# Patient Record
Sex: Male | Born: 1970 | Race: White | Hispanic: No | Marital: Single | Smoking: Former smoker
Health system: Southern US, Community
[De-identification: ages and names within clinical notes are randomized; demographics above are authoritative.]

## PROBLEM LIST (undated history)

## (undated) DIAGNOSIS — F419 Anxiety disorder, unspecified: Secondary | ICD-10-CM

## (undated) HISTORY — PX: CARPAL TUNNEL RELEASE: SHX101

## (undated) HISTORY — DX: Anxiety disorder, unspecified: F41.9

---

## 2015-02-03 ENCOUNTER — Other Ambulatory Visit: Payer: Self-pay | Admitting: Sports Medicine

## 2015-02-03 DIAGNOSIS — M545 Low back pain: Secondary | ICD-10-CM

## 2015-02-16 ENCOUNTER — Ambulatory Visit
Admission: RE | Admit: 2015-02-16 | Discharge: 2015-02-16 | Disposition: A | Discharge status: Home / Self Care | Payer: PRIVATE HEALTH INSURANCE | Source: Ambulatory Visit | Attending: Sports Medicine | Admitting: Sports Medicine

## 2015-02-16 ENCOUNTER — Other Ambulatory Visit: Payer: Self-pay | Admitting: Sports Medicine

## 2015-02-16 DIAGNOSIS — Z139 Encounter for screening, unspecified: Secondary | ICD-10-CM

## 2015-02-16 DIAGNOSIS — M545 Low back pain: Secondary | ICD-10-CM

## 2015-12-11 DIAGNOSIS — Z0001 Encounter for general adult medical examination with abnormal findings: Secondary | ICD-10-CM | POA: Diagnosis not present

## 2015-12-11 DIAGNOSIS — Z125 Encounter for screening for malignant neoplasm of prostate: Secondary | ICD-10-CM | POA: Diagnosis not present

## 2015-12-17 DIAGNOSIS — F419 Anxiety disorder, unspecified: Secondary | ICD-10-CM | POA: Diagnosis not present

## 2015-12-17 DIAGNOSIS — J309 Allergic rhinitis, unspecified: Secondary | ICD-10-CM | POA: Diagnosis not present

## 2015-12-17 DIAGNOSIS — Z0000 Encounter for general adult medical examination without abnormal findings: Secondary | ICD-10-CM | POA: Diagnosis not present

## 2016-03-10 DIAGNOSIS — Z23 Encounter for immunization: Secondary | ICD-10-CM | POA: Diagnosis not present

## 2016-06-10 DIAGNOSIS — E785 Hyperlipidemia, unspecified: Secondary | ICD-10-CM | POA: Diagnosis not present

## 2016-06-10 DIAGNOSIS — Z0000 Encounter for general adult medical examination without abnormal findings: Secondary | ICD-10-CM | POA: Diagnosis not present

## 2016-06-10 DIAGNOSIS — Z131 Encounter for screening for diabetes mellitus: Secondary | ICD-10-CM | POA: Diagnosis not present

## 2016-06-17 DIAGNOSIS — E669 Obesity, unspecified: Secondary | ICD-10-CM | POA: Diagnosis not present

## 2016-06-17 DIAGNOSIS — E7800 Pure hypercholesterolemia, unspecified: Secondary | ICD-10-CM | POA: Diagnosis not present

## 2016-07-05 IMAGING — MR MR LUMBAR SPINE W/O CM
5 series · 40 of 48 positions shown · non-contrast
Comparison: None.

CLINICAL DATA: Low back pain. Bilateral hip and buttock pain, lower
leg pain, and foot numbness for 3 months.

EXAM:
MRI LUMBAR SPINE WITHOUT CONTRAST
TECHNIQUE: Multiplanar, multisequence MR imaging of the lumbar spine was
performed. No intravenous contrast was administered.

[Series 3: T2 · sagittal · 4.0mm · 0.88mm/px · 6 of 12 slices shown (1 of 2)]
[im 1/12]
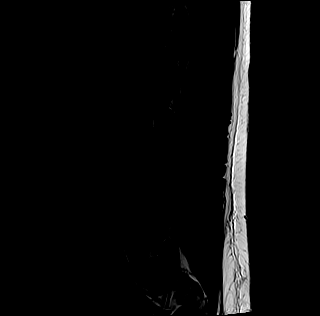
[im 3/12]
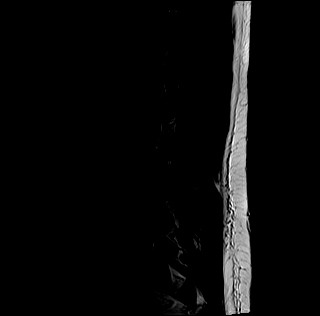
[im 5/12]
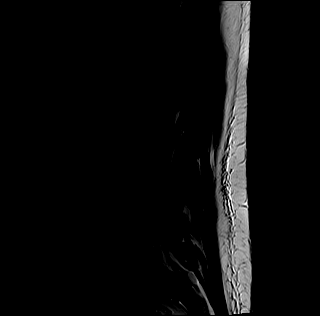
[im 7/12]
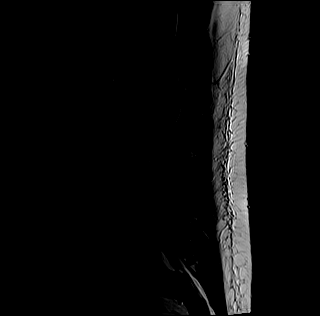
[im 9/12]
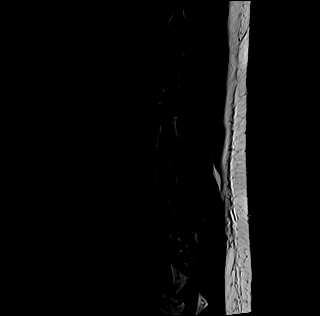
[im 12/12]
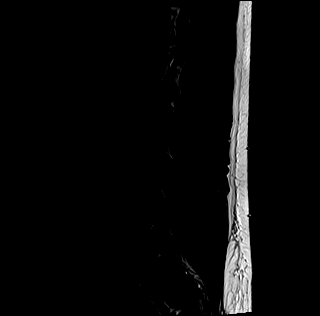

[Series 4: STIR · sagittal · 4.0mm · 0.55mm/px · 5 of 12 slices shown]
[im 1/12]
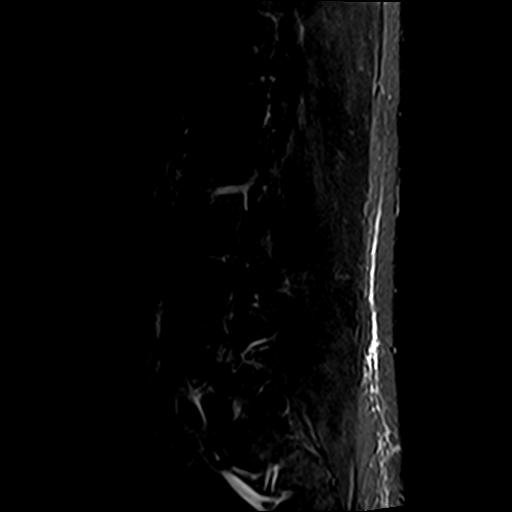
[im 3/12]
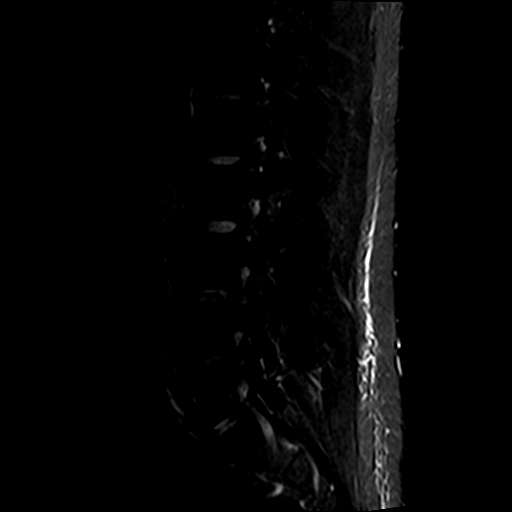
[im 6/12]
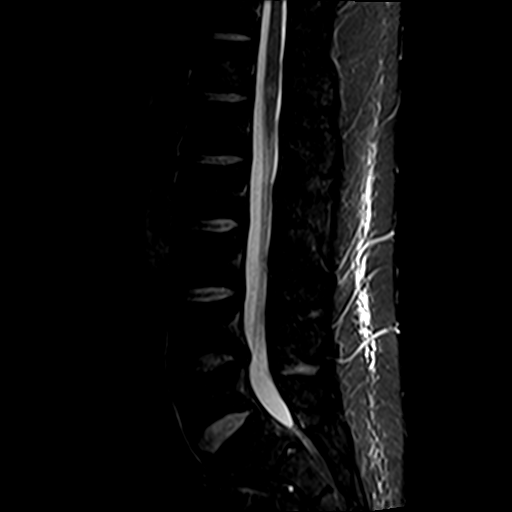
[im 9/12]
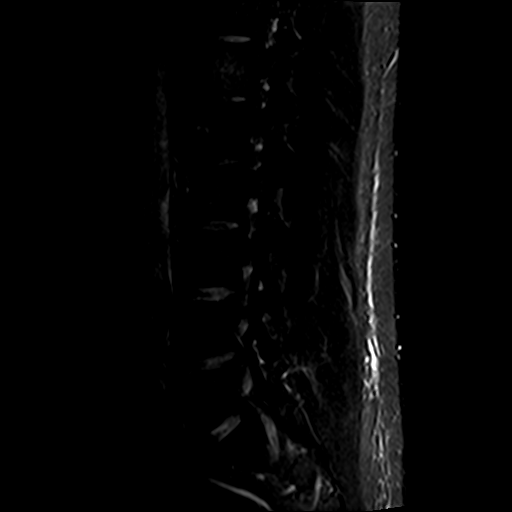
[im 12/12]
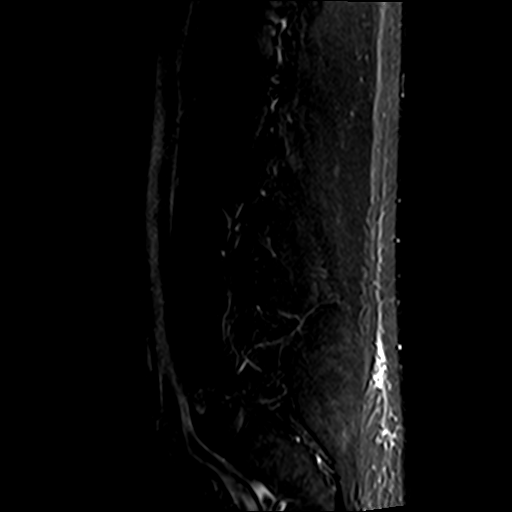

[Series 5: T1 · sagittal · 4.0mm · 0.88mm/px · 5 of 12 slices shown (1 of 2)]
[im 1/12]
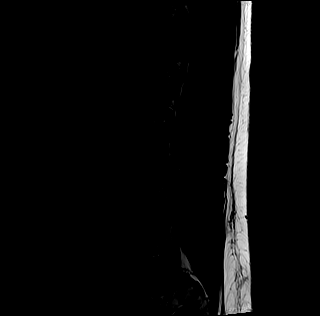
[im 3/12]
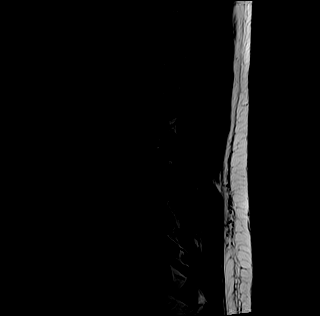
[im 6/12]
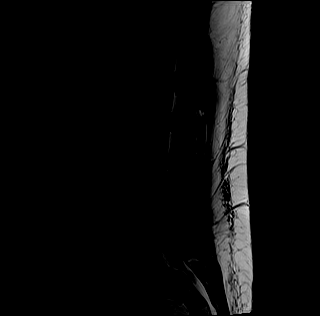
[im 9/12]
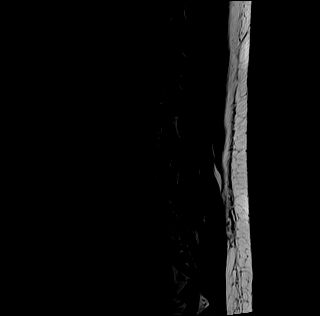
[im 12/12]
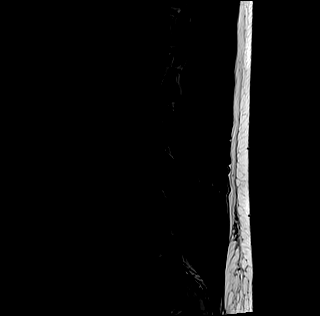

[Series 6: T2 · axial · 4.0mm · 0.70mm/px · z∈[-163,+19]mm · 14 of 36 slices shown (2 of 2)]
[im 1/36]
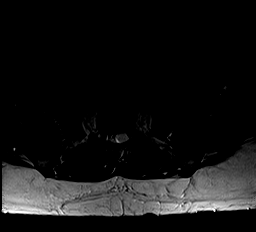
[im 3/36]
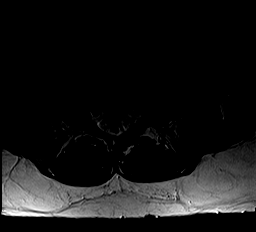
[im 5/36]
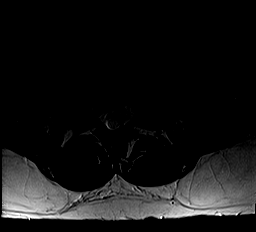
[im 8/36]
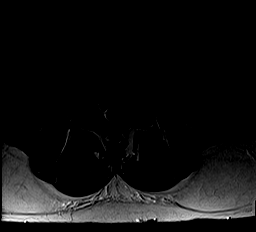
[im 10/36]
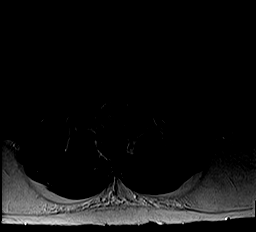
[im 12/36]
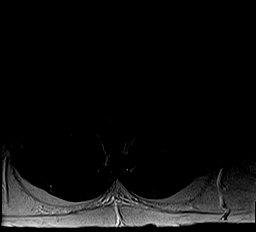
[im 15/36]
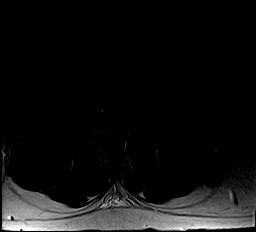
[im 17/36]
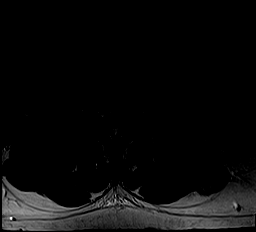
[im 19/36]
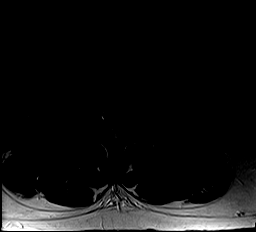
[im 22/36]
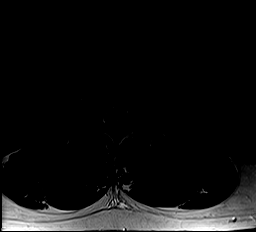
[im 24/36]
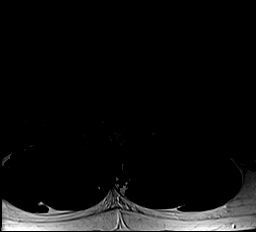
[im 26/36]
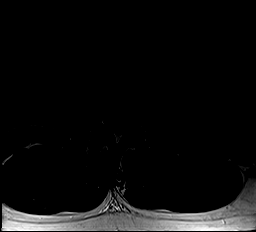
[im 31/36]
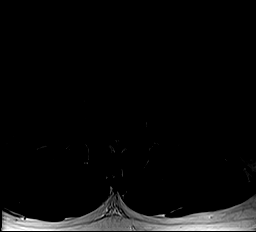
[im 36/36]
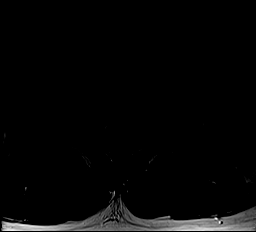

[Series 7: T1 · axial · 4.0mm · 0.47mm/px · z∈[-155,+19]mm · 10 of 36 slices shown (2 of 2)]
[im 3/36]
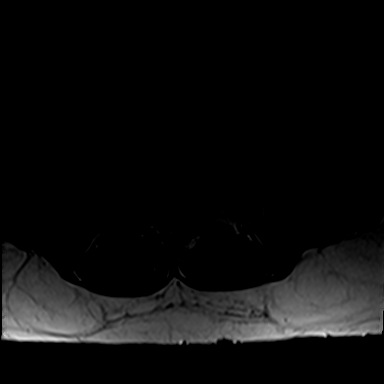
[im 5/36]
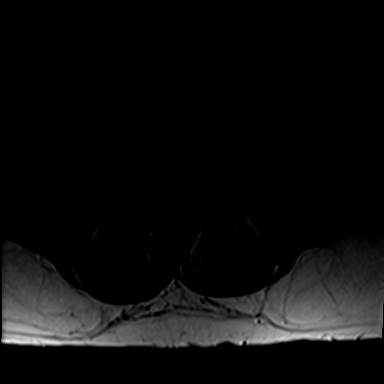
[im 8/36]
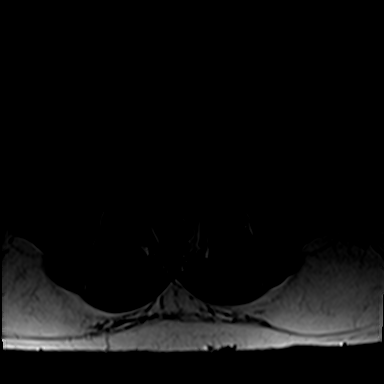
[im 12/36]
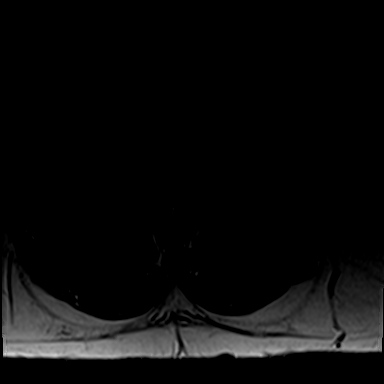
[im 17/36]
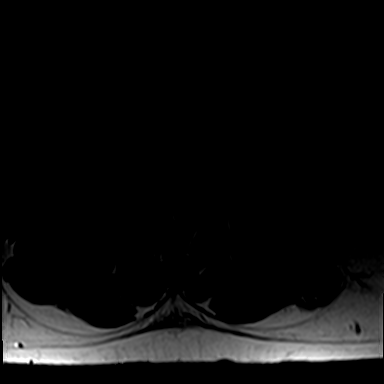
[im 19/36]
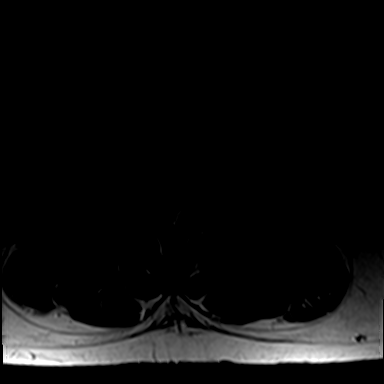
[im 22/36]
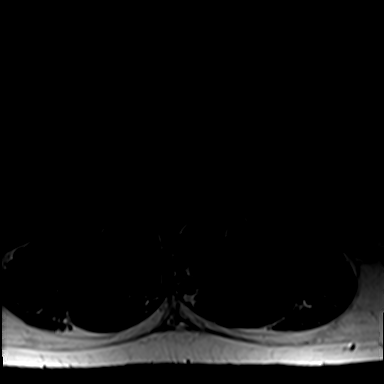
[im 26/36]
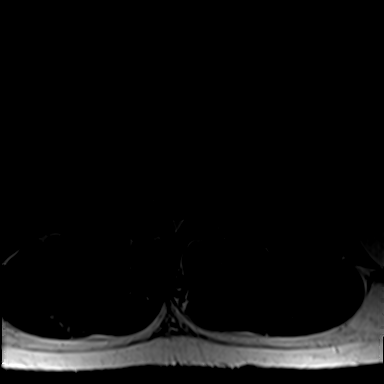
[im 31/36]
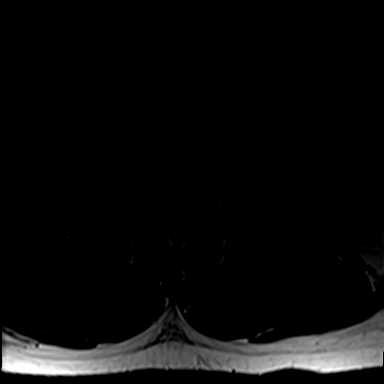
[im 36/36]
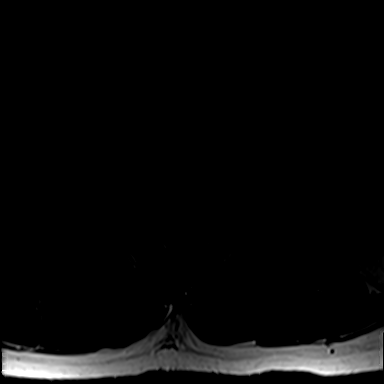

[40 of 48 positions shown; findings below may reference images not displayed]

FINDINGS: For the purposes of this dictation, the lowest well-formed
intervertebral disc space is presumed to be L5-S1.

There is straightening of the normal lumbar lordosis. There is no
significant listhesis. Vertebral body heights are preserved. Mild
disc desiccation is present at L4-5 with minimal disc space height
loss. Intervertebral discs elsewhere are well hydrated and
maintained in height. A 2.2 cm hemangioma is noted in the T12
vertebral body. Conus medullaris is normal in signal and terminates
at L1. Paraspinal soft tissues are unremarkable.

L1-2 through L3-4:  No disc herniation or stenosis.

L4-5: Small to moderate-sized central disc protrusion results in
mild-to-moderate bilateral lateral recess stenosis, potentially
affecting either L5 nerve root. There is circumferential disc
bulging with mild spinal stenosis and mild bilateral neural
foraminal stenosis.

L5-S1:  Negative.
IMPRESSION: L4-5 central disc protrusion with bilateral lateral recess stenosis,
potentially affecting the L5 nerve roots.

## 2016-12-22 DIAGNOSIS — E785 Hyperlipidemia, unspecified: Secondary | ICD-10-CM | POA: Diagnosis not present

## 2016-12-22 DIAGNOSIS — Z125 Encounter for screening for malignant neoplasm of prostate: Secondary | ICD-10-CM | POA: Diagnosis not present

## 2016-12-22 DIAGNOSIS — Z0001 Encounter for general adult medical examination with abnormal findings: Secondary | ICD-10-CM | POA: Diagnosis not present

## 2017-01-02 DIAGNOSIS — Z0000 Encounter for general adult medical examination without abnormal findings: Secondary | ICD-10-CM | POA: Diagnosis not present

## 2017-01-02 DIAGNOSIS — F419 Anxiety disorder, unspecified: Secondary | ICD-10-CM | POA: Diagnosis not present

## 2017-01-02 DIAGNOSIS — J309 Allergic rhinitis, unspecified: Secondary | ICD-10-CM | POA: Diagnosis not present

## 2017-01-02 DIAGNOSIS — E785 Hyperlipidemia, unspecified: Secondary | ICD-10-CM | POA: Diagnosis not present

## 2017-07-24 DIAGNOSIS — J029 Acute pharyngitis, unspecified: Secondary | ICD-10-CM | POA: Diagnosis not present

## 2017-07-24 DIAGNOSIS — K122 Cellulitis and abscess of mouth: Secondary | ICD-10-CM | POA: Diagnosis not present

## 2017-08-11 DIAGNOSIS — J309 Allergic rhinitis, unspecified: Secondary | ICD-10-CM | POA: Diagnosis not present

## 2017-08-11 DIAGNOSIS — H1045 Other chronic allergic conjunctivitis: Secondary | ICD-10-CM | POA: Diagnosis not present

## 2017-12-27 DIAGNOSIS — Z0000 Encounter for general adult medical examination without abnormal findings: Secondary | ICD-10-CM | POA: Diagnosis not present

## 2017-12-27 DIAGNOSIS — F419 Anxiety disorder, unspecified: Secondary | ICD-10-CM | POA: Diagnosis not present

## 2017-12-27 DIAGNOSIS — E559 Vitamin D deficiency, unspecified: Secondary | ICD-10-CM | POA: Diagnosis not present

## 2017-12-27 DIAGNOSIS — E785 Hyperlipidemia, unspecified: Secondary | ICD-10-CM | POA: Diagnosis not present

## 2017-12-27 DIAGNOSIS — Z125 Encounter for screening for malignant neoplasm of prostate: Secondary | ICD-10-CM | POA: Diagnosis not present

## 2018-01-03 DIAGNOSIS — Z0000 Encounter for general adult medical examination without abnormal findings: Secondary | ICD-10-CM | POA: Diagnosis not present

## 2018-01-16 DIAGNOSIS — K219 Gastro-esophageal reflux disease without esophagitis: Secondary | ICD-10-CM | POA: Diagnosis not present

## 2018-01-16 DIAGNOSIS — J342 Deviated nasal septum: Secondary | ICD-10-CM | POA: Diagnosis not present

## 2018-01-16 DIAGNOSIS — Z7289 Other problems related to lifestyle: Secondary | ICD-10-CM | POA: Diagnosis not present

## 2018-01-16 DIAGNOSIS — R0681 Apnea, not elsewhere classified: Secondary | ICD-10-CM | POA: Diagnosis not present

## 2018-01-18 DIAGNOSIS — M79604 Pain in right leg: Secondary | ICD-10-CM | POA: Diagnosis not present

## 2018-01-18 DIAGNOSIS — M79605 Pain in left leg: Secondary | ICD-10-CM | POA: Diagnosis not present

## 2018-01-18 DIAGNOSIS — M79669 Pain in unspecified lower leg: Secondary | ICD-10-CM | POA: Diagnosis not present

## 2018-04-16 DIAGNOSIS — K219 Gastro-esophageal reflux disease without esophagitis: Secondary | ICD-10-CM | POA: Diagnosis not present

## 2018-04-16 DIAGNOSIS — R0683 Snoring: Secondary | ICD-10-CM | POA: Diagnosis not present

## 2019-01-02 DIAGNOSIS — E785 Hyperlipidemia, unspecified: Secondary | ICD-10-CM | POA: Diagnosis not present

## 2019-01-02 DIAGNOSIS — Z125 Encounter for screening for malignant neoplasm of prostate: Secondary | ICD-10-CM | POA: Diagnosis not present

## 2019-01-02 DIAGNOSIS — Z0000 Encounter for general adult medical examination without abnormal findings: Secondary | ICD-10-CM | POA: Diagnosis not present

## 2019-01-09 DIAGNOSIS — Z7189 Other specified counseling: Secondary | ICD-10-CM | POA: Diagnosis not present

## 2019-01-09 DIAGNOSIS — Z0000 Encounter for general adult medical examination without abnormal findings: Secondary | ICD-10-CM | POA: Diagnosis not present

## 2019-01-09 DIAGNOSIS — E349 Endocrine disorder, unspecified: Secondary | ICD-10-CM | POA: Diagnosis not present

## 2019-01-09 DIAGNOSIS — E785 Hyperlipidemia, unspecified: Secondary | ICD-10-CM | POA: Diagnosis not present

## 2019-08-09 ENCOUNTER — Ambulatory Visit: Payer: PRIVATE HEALTH INSURANCE | Attending: Internal Medicine

## 2019-08-09 DIAGNOSIS — Z23 Encounter for immunization: Secondary | ICD-10-CM

## 2019-08-09 NOTE — Progress Notes (Signed)
   Covid-19 Vaccination Clinic  Name:  Brian Lin    MRN: [id number] DOB: 15-Mar-1971  08/09/2019  [name] was observed post Covid-19 immunization for 15 minutes without incident. He was provided with Vaccine Information Sheet and instruction to access the V-Safe system.   [name] was instructed to call 911 with any severe reactions post vaccine: Marland Kitchen Difficulty breathing  . Swelling of face and throat  . A fast heartbeat  . A bad rash all over body  . Dizziness and weakness   Immunizations Administered    Name Date Dose VIS Date Route   Pfizer COVID-19 Vaccine 08/09/2019  8:27 AM 0.3 mL 05/10/2019 Intramuscular   Manufacturer: [place]   Lot: [id number]   [place]: [address]-4

## 2019-09-02 ENCOUNTER — Ambulatory Visit: Payer: PRIVATE HEALTH INSURANCE | Attending: Internal Medicine

## 2019-09-02 DIAGNOSIS — Z23 Encounter for immunization: Secondary | ICD-10-CM

## 2019-09-02 NOTE — Progress Notes (Signed)
   Covid-19 Vaccination Clinic  Name:  Brian Lin    MRN: [id number] DOB: 12-Nov-1970  09/02/2019  [name] was observed post Covid-19 immunization for 15 minutes without incident. He was provided with Vaccine Information Sheet and instruction to access the V-Safe system.   [name] was instructed to call 911 with any severe reactions post vaccine: Marland Kitchen Difficulty breathing  . Swelling of face and throat  . A fast heartbeat  . A bad rash all over body  . Dizziness and weakness   Immunizations Administered    Name Date Dose VIS Date Route   Pfizer COVID-19 Vaccine 09/02/2019 11:29 AM 0.3 mL 05/10/2019 Intramuscular   Manufacturer: [place]   Lot: [id number]   [place]: [address]-9

## 2019-11-30 DIAGNOSIS — Z20822 Contact with and (suspected) exposure to covid-19: Secondary | ICD-10-CM | POA: Diagnosis not present

## 2019-12-09 DIAGNOSIS — K602 Anal fissure, unspecified: Secondary | ICD-10-CM | POA: Diagnosis not present

## 2020-01-06 DIAGNOSIS — Z0000 Encounter for general adult medical examination without abnormal findings: Secondary | ICD-10-CM | POA: Diagnosis not present

## 2020-01-06 DIAGNOSIS — E349 Endocrine disorder, unspecified: Secondary | ICD-10-CM | POA: Diagnosis not present

## 2020-01-06 DIAGNOSIS — E785 Hyperlipidemia, unspecified: Secondary | ICD-10-CM | POA: Diagnosis not present

## 2020-01-06 DIAGNOSIS — R7309 Other abnormal glucose: Secondary | ICD-10-CM | POA: Diagnosis not present

## 2020-01-13 DIAGNOSIS — R7309 Other abnormal glucose: Secondary | ICD-10-CM | POA: Diagnosis not present

## 2020-01-13 DIAGNOSIS — Z0000 Encounter for general adult medical examination without abnormal findings: Secondary | ICD-10-CM | POA: Diagnosis not present

## 2020-01-13 DIAGNOSIS — R7989 Other specified abnormal findings of blood chemistry: Secondary | ICD-10-CM | POA: Diagnosis not present

## 2020-01-13 DIAGNOSIS — E785 Hyperlipidemia, unspecified: Secondary | ICD-10-CM | POA: Diagnosis not present

## 2021-01-06 DIAGNOSIS — E785 Hyperlipidemia, unspecified: Secondary | ICD-10-CM | POA: Diagnosis not present

## 2021-01-06 DIAGNOSIS — Z125 Encounter for screening for malignant neoplasm of prostate: Secondary | ICD-10-CM | POA: Diagnosis not present

## 2021-01-06 DIAGNOSIS — R7309 Other abnormal glucose: Secondary | ICD-10-CM | POA: Diagnosis not present

## 2021-01-06 DIAGNOSIS — R7989 Other specified abnormal findings of blood chemistry: Secondary | ICD-10-CM | POA: Diagnosis not present

## 2022-01-11 DIAGNOSIS — R7309 Other abnormal glucose: Secondary | ICD-10-CM | POA: Diagnosis not present

## 2022-01-11 DIAGNOSIS — E782 Mixed hyperlipidemia: Secondary | ICD-10-CM | POA: Diagnosis not present

## 2022-01-11 DIAGNOSIS — Z125 Encounter for screening for malignant neoplasm of prostate: Secondary | ICD-10-CM | POA: Diagnosis not present

## 2022-01-11 DIAGNOSIS — R7989 Other specified abnormal findings of blood chemistry: Secondary | ICD-10-CM | POA: Diagnosis not present

## 2022-01-11 DIAGNOSIS — Z0000 Encounter for general adult medical examination without abnormal findings: Secondary | ICD-10-CM | POA: Diagnosis not present

## 2022-01-18 DIAGNOSIS — Z0000 Encounter for general adult medical examination without abnormal findings: Secondary | ICD-10-CM | POA: Diagnosis not present

## 2022-01-18 DIAGNOSIS — E785 Hyperlipidemia, unspecified: Secondary | ICD-10-CM | POA: Diagnosis not present

## 2022-07-19 DIAGNOSIS — Z0100 Encounter for examination of eyes and vision without abnormal findings: Secondary | ICD-10-CM | POA: Diagnosis not present

## 2022-09-20 DIAGNOSIS — K648 Other hemorrhoids: Secondary | ICD-10-CM | POA: Diagnosis not present

## 2022-09-20 DIAGNOSIS — R030 Elevated blood-pressure reading, without diagnosis of hypertension: Secondary | ICD-10-CM | POA: Diagnosis not present

## 2022-10-25 DIAGNOSIS — K644 Residual hemorrhoidal skin tags: Secondary | ICD-10-CM | POA: Diagnosis not present

## 2022-10-25 DIAGNOSIS — K625 Hemorrhage of anus and rectum: Secondary | ICD-10-CM | POA: Diagnosis not present

## 2022-10-25 DIAGNOSIS — K648 Other hemorrhoids: Secondary | ICD-10-CM | POA: Diagnosis not present

## 2022-10-28 ENCOUNTER — Encounter: Payer: Self-pay | Admitting: Gastroenterology

## 2023-01-16 DIAGNOSIS — Z125 Encounter for screening for malignant neoplasm of prostate: Secondary | ICD-10-CM | POA: Diagnosis not present

## 2023-01-16 DIAGNOSIS — E785 Hyperlipidemia, unspecified: Secondary | ICD-10-CM | POA: Diagnosis not present

## 2023-01-16 DIAGNOSIS — R7989 Other specified abnormal findings of blood chemistry: Secondary | ICD-10-CM | POA: Diagnosis not present

## 2023-01-16 DIAGNOSIS — R7309 Other abnormal glucose: Secondary | ICD-10-CM | POA: Diagnosis not present

## 2023-01-23 DIAGNOSIS — Z0000 Encounter for general adult medical examination without abnormal findings: Secondary | ICD-10-CM | POA: Diagnosis not present

## 2023-01-23 DIAGNOSIS — R7989 Other specified abnormal findings of blood chemistry: Secondary | ICD-10-CM | POA: Diagnosis not present

## 2023-01-23 DIAGNOSIS — R7309 Other abnormal glucose: Secondary | ICD-10-CM | POA: Diagnosis not present

## 2023-01-23 DIAGNOSIS — E785 Hyperlipidemia, unspecified: Secondary | ICD-10-CM | POA: Diagnosis not present

## 2023-01-25 ENCOUNTER — Encounter: Payer: Self-pay | Admitting: Gastroenterology

## 2023-01-26 ENCOUNTER — Ambulatory Visit: Payer: BC Managed Care – PPO | Admitting: Gastroenterology

## 2023-01-26 ENCOUNTER — Encounter: Payer: Self-pay | Admitting: Gastroenterology

## 2023-01-26 VITALS — BP 122/70 | HR 74 | Ht 70.0 in | Wt 259.0 lb

## 2023-01-26 DIAGNOSIS — K625 Hemorrhage of anus and rectum: Secondary | ICD-10-CM

## 2023-01-26 DIAGNOSIS — Z1211 Encounter for screening for malignant neoplasm of colon: Secondary | ICD-10-CM | POA: Diagnosis not present

## 2023-01-26 DIAGNOSIS — K219 Gastro-esophageal reflux disease without esophagitis: Secondary | ICD-10-CM | POA: Diagnosis not present

## 2023-01-26 MED ORDER — NA SULFATE-K SULFATE-MG SULF 17.5-3.13-1.6 GM/177ML PO SOLN: 1.0000 | Freq: Once | ORAL | 0 refills | Status: AC

## 2023-01-26 NOTE — Patient Instructions (Signed)
You have been scheduled for an endoscopy and colonoscopy. Please follow the written instructions given to you at your visit today.  Please pick up your prep supplies at the pharmacy within the next 1-3 days.  If you use inhalers (even only as needed), please bring them with you on the day of your procedure.  DO NOT TAKE 7 DAYS PRIOR TO TEST- Trulicity (dulaglutide) Ozempic, Wegovy (semaglutide) Mounjaro (tirzepatide) Bydureon Bcise (exanatide extended release)  DO NOT TAKE 1 DAY PRIOR TO YOUR TEST Rybelsus (semaglutide) Adlyxin (lixisenatide) Victoza (liraglutide) Byetta (exanatide) ___________________________________________________________________________   If your blood pressure at your visit was 140/90 or greater, please contact your primary care physician to follow up on this.  _______________________________________________________  If you are age 55 or older, your body mass index should be between 23-30. Your Body mass index is 37.16 kg/m. If this is out of the aforementioned range listed, please consider follow up with your Primary Care Provider.  If you are age 5 or younger, your body mass index should be between 19-25. Your Body mass index is 37.16 kg/m. If this is out of the aformentioned range listed, please consider follow up with your Primary Care Provider.   ________________________________________________________  The [facility] providers would like to encourage you to use [facility] to communicate with providers for non-urgent requests or questions.  Due to long hold times on the telephone, sending your provider a message by [facility] may be a faster and more efficient way to get a response.  Please allow 48 business hours for a response.  Please remember that this is for non-urgent requests.  _______________________________________________________

## 2023-01-26 NOTE — Progress Notes (Signed)
HPI : Brian Lin is a 52 y.o. male who is referred to [place] by Andria Meuse, MD for consideration of colonoscopy.  The patient was initially referred to Dr. Cliffton Asters for further management of hemorrhoidal symptoms.  His symptoms consisted of bright red blood per rectum and perianal swelling/prolapse.  Dr. Cliffton Asters performed an anoscopy which showed 2 prominent hemorrhoid columns.  He recommended treatment with Anusol suppositories, and discussed optimizing bowel habits and toilet habits. Today, the patient denies any hemorrhoidal symptoms.  Specifically, he denies symptoms of bright red blood per rectum, perianal itching or burning, perianal swelling, prolapse or issues with seepage or difficulties with cleanliness in the area. He reports having regular bowel movements and denies problems with hard stools, straining or diarrhea.  No family history of colon cancer.  The patient reportedly had a negative Cologuard a year ago (results not available for review).  He reports his girlfriend was diagnosed with colon cancer at age 3 last year.  When asked, he does have a long history of typical GERD symptoms consisting of heartburn and acid regurgitation.  He started having symptoms over 15 years ago.  He had previously taken PPIs, which helped.  He made dietary improvements, and this is decreased his frequency of symptoms.  Currently, he estimates having GERD symptoms maybe once or twice a week, for which he takes over-the-counter antacids.  He has a history of chronic tobacco use, but quit many years ago.  No family history of esophageal cancer. No dysphagia, no unintentional weight loss.    Past Medical History:  Diagnosis Date   Anxiety      Past Surgical History:  Procedure Laterality Date   CARPAL TUNNEL RELEASE     Family History  Problem Relation Age of Onset   Obesity Father    High blood pressure Sister    High blood pressure Sister    Colon cancer Neg Hx    Stomach cancer Neg Hx     Esophageal cancer Neg Hx    Social History   Tobacco Use   Smoking status: Former    Types: Cigarettes   Smokeless tobacco: Never  Vaping Use   Vaping status: Never Used  Substance Use Topics   Alcohol use: Yes    Comment: 6 drink a week   Drug use: Never   Current Outpatient Medications  Medication Sig Dispense Refill   b complex vitamins capsule Take 1 capsule by mouth daily.     Na Sulfate-K Sulfate-Mg Sulf 17.5-3.13-1.6 GM/177ML SOLN Take 1 kit by mouth once for 1 dose. 354 mL 0   No current facility-administered medications for this visit.   Allergies  Allergen Reactions   Codeine Rash   Sulfamethoxazole Rash     Review of Systems: All systems reviewed and negative except where noted in HPI.    No results found.  Physical Exam: BP 122/70   Pulse 74   Ht 5\' 10"  (1.778 m)   Wt 259 lb (117.5 kg)   BMI 37.16 kg/m  Constitutional: Pleasant,well-developed, Caucasian male in no acute distress. HEENT: Normocephalic and atraumatic. Conjunctivae are normal. No scleral icterus. Neck supple.  Cardiovascular: Normal rate, regular rhythm.  Pulmonary/chest: Effort normal and breath sounds normal. No wheezing, rales or rhonchi. Abdominal: Soft, nondistended, nontender. Bowel sounds active throughout. There are no masses palpable. No hepatomegaly. Extremities: no edema Lymphadenopathy: No cervical adenopathy noted. Neurological: Alert and oriented to person place and time. Skin: Skin is warm and dry. No rashes noted.  Psychiatric: Normal mood and affect. Behavior is normal.  CBC No results found for: "WBC", "RBC", "HGB", "HCT", "PLT", "MCV", "MCH", "MCHC", "RDW", "LYMPHSABS", "MONOABS", "EOSABS", "BASOSABS"  CMP  No results found for: "NA", "K", "CL", "CO2", "GLUCOSE", "BUN", "CREATININE", "CALCIUM", "PROT", "ALBUMIN", "AST", "ALT", "ALKPHOS", "BILITOT", "GFRNONAA", "GFRAA"      No data to display            ASSESSMENT AND PLAN:  52 year old male with  history of typical hemorrhoid symptoms (bright red blood per rectum, perianal swelling/prolapse, currently resolved.  He denies any hemorrhoidal symptoms or alterations in his bowels currently.  He had a negative Cologuard last year, but given his recent bright red blood per rectum and no prior history of colonoscopy, I do think a colonoscopy is reasonable to definitively exclude mass lesion.  The patient is in agreement with this.  Also, given his longstanding GERD symptoms as well as Barrett's risk factors to include sex, age, obesity and history of tobacco use, I do think an EGD to screen for Barrett's is reasonable.  Given his relatively infrequent symptoms currently, I recommend he continue using as needed medicines.  If we do find evidence of Barrett's or other complications of GERD, he will need to take PPI. Will plan for EGD and colonoscopy.  Bright red blood per rectum/colon cancer screening - Colonoscopy  Longstanding GERD with Barrett's risk factors - EGD for Barrett's screening  The details, risks (including bleeding, perforation, infection, missed lesions, medication reactions and possible hospitalization or surgery if complications occur), benefits, and alternatives to colonoscopy with possible biopsy and possible polypectomy were discussed with the patient and he consents to proceed.   Kalayna Noy [name], MD [facility]    Cliffton Asters Stephanie Coup, MD

## 2023-01-27 ENCOUNTER — Encounter: Payer: Self-pay | Admitting: Gastroenterology

## 2023-02-09 ENCOUNTER — Encounter: Payer: Self-pay | Admitting: Gastroenterology

## 2023-02-09 ENCOUNTER — Ambulatory Visit (AMBULATORY_SURGERY_CENTER): Payer: BC Managed Care – PPO | Admitting: Gastroenterology

## 2023-02-09 VITALS — BP 146/90 | HR 77 | Temp 98.9°F | Resp 12 | Ht 70.0 in | Wt 259.0 lb

## 2023-02-09 DIAGNOSIS — K635 Polyp of colon: Secondary | ICD-10-CM | POA: Diagnosis not present

## 2023-02-09 DIAGNOSIS — K625 Hemorrhage of anus and rectum: Secondary | ICD-10-CM | POA: Diagnosis not present

## 2023-02-09 DIAGNOSIS — Z1211 Encounter for screening for malignant neoplasm of colon: Secondary | ICD-10-CM

## 2023-02-09 DIAGNOSIS — K219 Gastro-esophageal reflux disease without esophagitis: Secondary | ICD-10-CM

## 2023-02-09 DIAGNOSIS — D122 Benign neoplasm of ascending colon: Secondary | ICD-10-CM | POA: Diagnosis not present

## 2023-02-09 DIAGNOSIS — D123 Benign neoplasm of transverse colon: Secondary | ICD-10-CM

## 2023-02-09 DIAGNOSIS — K208 Other esophagitis without bleeding: Secondary | ICD-10-CM | POA: Diagnosis not present

## 2023-02-09 DIAGNOSIS — K2100 Gastro-esophageal reflux disease with esophagitis, without bleeding: Secondary | ICD-10-CM

## 2023-02-09 MED ORDER — OMEPRAZOLE 40 MG PO CPDR: 40.0000 mg | DELAYED_RELEASE_CAPSULE | Freq: Every day | ORAL | 1 refills | Status: AC

## 2023-02-09 MED ORDER — SODIUM CHLORIDE 0.9 % IV SOLN: 500.0000 mL | Freq: Once | INTRAVENOUS | Status: DC

## 2023-02-09 NOTE — Op Note (Signed)
[facility] Patient Name: Brian Lin Procedure Date: 02/09/2023 2:05 PM MRN: [id number] Endoscopist: [name] [name] , MD, [id number] Age: 52 Referring MD:  Date of Birth: 31-Jan-1971 Gender: Male Account #: [id number] Procedure:                Colonoscopy Indications:              This is the patient's first colonoscopy,                            Hematochezia Medicines:                Monitored Anesthesia Care Procedure:                Pre-Anesthesia Assessment:                           - Prior to the procedure, a History and Physical                            was performed, and patient medications and                            allergies were reviewed. The patient's tolerance of                            previous anesthesia was also reviewed. The risks                            and benefits of the procedure and the sedation                            options and risks were discussed with the patient.                            All questions were answered, and informed consent                            was obtained. Prior Anticoagulants: The patient has                            taken no anticoagulant or antiplatelet agents. ASA                            Grade Assessment: II - A patient with mild systemic                            disease. After reviewing the risks and benefits,                            the patient was deemed in satisfactory condition to                            undergo the procedure.  After obtaining informed consent, the colonoscope                            was passed under direct vision. Throughout the                            procedure, the patient's blood pressure, pulse, and                            oxygen saturations were monitored continuously. The                            CF [id number] #[id number] was introduced through the anus                            and advanced to the the cecum, identified by                             appendiceal orifice and ileocecal valve. The                            colonoscopy was performed with moderate difficulty                            due to a redundant colon and significant looping.                            Successful completion of the procedure was aided by                            using manual pressure and withdrawing and                            reinserting the scope. Visualization of the cecum                            was difficult due to significant looping (the                            colonoscope was 'hubbed') and copious adherent                            stool in the cecum. The patient tolerated the                            procedure well. The quality of the bowel                            preparation was good except the cecum was fair. The                            ileocecal valve, appendiceal orifice, and rectum  were photographed. Scope In: 2:24:28 PM Scope Out: 2:55:51 PM Scope Withdrawal Time: 0 hours 16 minutes 53 seconds  Total Procedure Duration: 0 hours 31 minutes 23 seconds  Findings:                 The perianal and digital rectal examinations were                            normal. Pertinent negatives include normal                            sphincter tone and no palpable rectal lesions.                           A 3 mm polyp was found in the ascending colon. The                            polyp was sessile. The polyp was removed with a                            cold snare. Resection and retrieval were complete.                            Estimated blood loss was minimal.                           A 4 mm polyp was found in the transverse colon. The                            polyp was sessile. The polyp was removed with a                            cold snare. Resection and retrieval were complete.                            Estimated blood loss was minimal.                           Many  large-mouthed and small-mouthed diverticula                            were found in the sigmoid colon, descending colon                            and transverse colon. There was no evidence of                            diverticular bleeding.                           The exam was otherwise normal throughout the                            examined colon.  The retroflexed view of the distal rectum and anal                            verge was normal and showed no anal or rectal                            abnormalities. Complications:            No immediate complications. Estimated Blood Loss:     Estimated blood loss was minimal. Impression:               - One 3 mm polyp in the ascending colon, removed                            with a cold snare. Resected and retrieved.                           - One 4 mm polyp in the transverse colon, removed                            with a cold snare. Resected and retrieved.                           - Moderate diverticulosis in the sigmoid colon, in                            the descending colon and in the transverse colon.                            There was no evidence of diverticular bleeding.                           - The distal rectum and anal verge are normal on                            retroflexion view. Recommendation:           - Patient has a contact number available for                            emergencies. The signs and symptoms of potential                            delayed complications were discussed with the                            patient. Return to normal activities tomorrow.                            Written discharge instructions were provided to the                            patient.                           -  Resume previous diet.                           - Continue present medications.                           - Await pathology results.                           - Repeat  colonoscopy in 3 years for surveillance                            due to presence of polyps, fair prep in cecum and                            technical difficulty of the procedure.                           - Recommend high fiber diet or daily fiber                            supplementation to reduce risk of diverticular                            complications. Amarri Michaelson [name], MD 02/09/2023 3:13:06 PM This report has been signed electronically.

## 2023-02-09 NOTE — Progress Notes (Signed)
History and Physical Interval Note:  02/09/2023 2:04 PM  Brian Lin  has presented today for endoscopic procedure(s), with the diagnosis of  Encounter Diagnoses  Name Primary?   Bright red blood per rectum Yes   Gastroesophageal reflux disease, unspecified whether esophagitis present   .  The various methods of evaluation and treatment have been discussed with the patient and/or family. After consideration of risks, benefits and other options for treatment, the patient has consented to  the endoscopic procedure(s).   The patient's history has been reviewed, patient examined, no change in status, stable for endoscopic procedure(s).  I have reviewed the patient's chart and labs.  Questions were answered to the patient's satisfaction.     Daleiza Bacchi [name], MD [facility]

## 2023-02-09 NOTE — Progress Notes (Signed)
Report to PACU, RN, vss, BBS= Clear.  

## 2023-02-09 NOTE — Op Note (Signed)
[facility] Patient Name: Brian Lin Procedure Date: 02/09/2023 2:06 PM MRN: [id number] Endoscopist: [name] [name] , MD, [id number] Age: 52 Referring MD:  Date of Birth: 03-05-1971 Gender: Male Account #: [id number] Procedure:                Upper GI endoscopy Indications:              Screening for Barrett's esophagus Medicines:                Monitored Anesthesia Care Procedure:                Pre-Anesthesia Assessment:                           - Prior to the procedure, a History and Physical                            was performed, and patient medications and                            allergies were reviewed. The patient's tolerance of                            previous anesthesia was also reviewed. The risks                            and benefits of the procedure and the sedation                            options and risks were discussed with the patient.                            All questions were answered, and informed consent                            was obtained. Prior Anticoagulants: The patient has                            taken no anticoagulant or antiplatelet agents. ASA                            Grade Assessment: II - A patient with mild systemic                            disease. After reviewing the risks and benefits,                            the patient was deemed in satisfactory condition to                            undergo the procedure.                           After obtaining informed consent, the endoscope was  passed under direct vision. Throughout the                            procedure, the patient's blood pressure, pulse, and                            oxygen saturations were monitored continuously. The                            GIF [id number] #[id number] was introduced through the                            mouth, and advanced to the second part of duodenum.                            The upper GI  endoscopy was accomplished without                            difficulty. The patient tolerated the procedure                            well. Scope In: Scope Out: Findings:                 The examined portions of the nasopharynx,                            oropharynx and larynx were normal.                           LA Grade A (one or more mucosal breaks less than 5                            mm, not extending between tops of 2 mucosal folds)                            esophagitis with no bleeding was found.                           One tongue of salmon-colored mucosa was present at                            38 cm. The maximum longitudinal extent of these                            esophageal mucosal changes was 1 cm in length.                            Biopsies were taken with a cold forceps for                            histology. Estimated blood loss was minimal.  The exam of the esophagus was otherwise normal.                           The gastroesophageal flap valve was visualized                            endoscopically and classified as Hill Grade IV (no                            fold, wide open lumen, hiatal hernia present).                           A 2 cm hiatal hernia was present.                           The entire examined stomach was normal.                           The examined duodenum was normal. Complications:            No immediate complications. Estimated Blood Loss:     Estimated blood loss was minimal. Impression:               - The examined portions of the nasopharynx,                            oropharynx and larynx were normal.                           - LA Grade A reflux esophagitis with no bleeding.                           - Salmon-colored mucosa suspicious for                            short-segment Barrett's esophagus. Biopsied.                           - Gastroesophageal flap valve classified as Hill                             Grade IV (no fold, wide open lumen, hiatal hernia                            present).                           - 2 cm hiatal hernia.                           - Normal stomach.                           - Normal examined duodenum. Recommendation:           - Patient has a contact number available for  emergencies. The signs and symptoms of potential                            delayed complications were discussed with the                            patient. Return to normal activities tomorrow.                            Written discharge instructions were provided to the                            patient.                           - Resume previous diet.                           - Continue present medications.                           - Await pathology results.                           - Use Prilosec (omeprazole) 40 mg PO daily for 8                            weeks to promote healing of esophagitis. [name] [name], MD 02/09/2023 3:03:57 PM This report has been signed electronically.

## 2023-02-09 NOTE — Patient Instructions (Signed)

## 2023-02-09 NOTE — Progress Notes (Signed)
Called to room to assist during endoscopic procedure.  Patient ID and intended procedure confirmed with present staff. Received instructions for my participation in the procedure from the performing physician.  

## 2023-02-09 NOTE — Progress Notes (Signed)
Pt's states no medical or surgical changes since previsit or office visit. 

## 2023-02-10 ENCOUNTER — Telehealth: Payer: Self-pay | Admitting: *Deleted

## 2023-02-10 NOTE — Telephone Encounter (Signed)
  Follow up Call-     02/09/2023    1:59 PM  Call back number  Post procedure Call Back phone  # [phone]  Permission to leave phone message Yes     Patient questions:  Do you have a fever, pain , or abdominal swelling? No. Pain Score  0 *  Have you tolerated food without any problems? Yes.    Have you been able to return to your normal activities? Yes.    Do you have any questions about your discharge instructions: Diet   No. Medications  No. Follow up visit  No.  Do you have questions or concerns about your Care? No.  Actions: * If pain score is 4 or above: No action needed, pain <4.

## 2023-02-14 LAB — SURGICAL PATHOLOGY

## 2023-02-17 NOTE — Progress Notes (Signed)
Brian Lin,  The biopsies of your esophagus showed inflammatory changes from acid reflux, but no evidence of Barrett's esophagus.  Please take the omeprazole as discussed to help heal the esophagitis.  You do not need a repeat EGD.  The two polyps that I removed from your colon were completely benign but were proven to be "pre-cancerous" polyps that MAY have grown into cancers if they had not been removed.  Studies shows that at least 20% of women over age 63 and 30% of men over age 58 have pre-cancerous polyps.  Because of the presence of these polyps, the technical difficulty of your colonoscopy and suboptimal visualization of some parts of your colon due to residual stool, I recommend that you have a repeat colonoscopy in 3 years.   If you develop any new rectal bleeding, abdominal pain or significant bowel habit changes, please contact me before then.

## 2024-09-02 ENCOUNTER — Ambulatory Visit: Admitting: Physician Assistant
